# Patient Record
Sex: Female | Born: 1940 | Race: Black or African American | Hispanic: No | Marital: Married | Smoking: Never smoker
Health system: Southern US, Community
[De-identification: ages and names within clinical notes are randomized; demographics above are authoritative.]

## PROBLEM LIST (undated history)

## (undated) DIAGNOSIS — E785 Hyperlipidemia, unspecified: Secondary | ICD-10-CM

## (undated) DIAGNOSIS — R001 Bradycardia, unspecified: Secondary | ICD-10-CM

## (undated) DIAGNOSIS — I10 Essential (primary) hypertension: Secondary | ICD-10-CM

## (undated) DIAGNOSIS — K55049 Acute infarction of large intestine, extent unspecified: Secondary | ICD-10-CM

## (undated) DIAGNOSIS — I4891 Unspecified atrial fibrillation: Secondary | ICD-10-CM

## (undated) DIAGNOSIS — Z87440 Personal history of urinary (tract) infections: Secondary | ICD-10-CM

## (undated) DIAGNOSIS — L739 Follicular disorder, unspecified: Secondary | ICD-10-CM

## (undated) DIAGNOSIS — IMO0002 Reserved for concepts with insufficient information to code with codable children: Secondary | ICD-10-CM

## (undated) DIAGNOSIS — R3129 Other microscopic hematuria: Secondary | ICD-10-CM

## (undated) DIAGNOSIS — H35341 Macular cyst, hole, or pseudohole, right eye: Secondary | ICD-10-CM

## (undated) DIAGNOSIS — M778 Other enthesopathies, not elsewhere classified: Secondary | ICD-10-CM

## (undated) HISTORY — DX: Bradycardia, unspecified: R00.1

## (undated) HISTORY — DX: Reserved for concepts with insufficient information to code with codable children: IMO0002

## (undated) HISTORY — DX: Follicular disorder, unspecified: L73.9

## (undated) HISTORY — DX: Hyperlipidemia, unspecified: E78.5

## (undated) HISTORY — DX: Acute infarction of large intestine, extent unspecified: K55.049

## (undated) HISTORY — DX: Personal history of urinary (tract) infections: Z87.440

## (undated) HISTORY — DX: Unspecified atrial fibrillation: I48.91

## (undated) HISTORY — DX: Essential (primary) hypertension: I10

## (undated) HISTORY — DX: Other microscopic hematuria: R31.29

## (undated) HISTORY — DX: Other enthesopathies, not elsewhere classified: M77.8

## (undated) HISTORY — DX: Macular cyst, hole, or pseudohole, right eye: H35.341

---

## 1984-09-11 HISTORY — PX: ABDOMINAL HYSTERECTOMY: SHX81

## 2004-06-15 ENCOUNTER — Ambulatory Visit: Payer: Self-pay | Admitting: Internal Medicine

## 2004-06-30 ENCOUNTER — Ambulatory Visit: Payer: Self-pay | Admitting: Family Medicine

## 2005-07-03 ENCOUNTER — Ambulatory Visit: Payer: Self-pay | Admitting: Family Medicine

## 2006-08-23 ENCOUNTER — Ambulatory Visit: Payer: Self-pay | Admitting: Family Medicine

## 2008-03-06 ENCOUNTER — Ambulatory Visit: Payer: Self-pay | Admitting: Family Medicine

## 2008-03-09 ENCOUNTER — Ambulatory Visit: Payer: Self-pay | Admitting: Family Medicine

## 2008-03-11 HISTORY — PX: BREAST BIOPSY: SHX20

## 2008-09-14 ENCOUNTER — Ambulatory Visit: Payer: Self-pay | Admitting: General Surgery

## 2009-01-06 ENCOUNTER — Ambulatory Visit: Payer: Self-pay | Admitting: Ophthalmology

## 2009-01-20 ENCOUNTER — Ambulatory Visit: Payer: Self-pay | Admitting: Ophthalmology

## 2009-04-06 ENCOUNTER — Ambulatory Visit: Payer: Self-pay | Admitting: Family Medicine

## 2009-09-11 HISTORY — PX: EYE SURGERY: SHX253

## 2010-03-08 ENCOUNTER — Emergency Department: Payer: Self-pay | Admitting: Emergency Medicine

## 2010-08-11 ENCOUNTER — Ambulatory Visit: Payer: Self-pay | Admitting: Family Medicine

## 2011-04-18 ENCOUNTER — Ambulatory Visit: Payer: Self-pay | Admitting: Family Medicine

## 2011-09-08 ENCOUNTER — Ambulatory Visit: Payer: Self-pay | Admitting: Family Medicine

## 2011-11-29 ENCOUNTER — Ambulatory Visit: Payer: Self-pay | Admitting: Gastroenterology

## 2012-10-22 ENCOUNTER — Ambulatory Visit: Payer: Self-pay | Admitting: Family Medicine

## 2013-04-29 ENCOUNTER — Ambulatory Visit: Payer: Self-pay

## 2013-11-05 ENCOUNTER — Ambulatory Visit: Payer: Self-pay | Admitting: Family Medicine

## 2013-11-12 ENCOUNTER — Ambulatory Visit: Payer: Self-pay | Admitting: Family Medicine

## 2014-12-30 ENCOUNTER — Ambulatory Visit
Admit: 2014-12-30 | Disposition: A | Discharge status: Home / Self Care | Payer: Self-pay | Attending: Family Medicine | Admitting: Family Medicine

## 2015-07-13 ENCOUNTER — Other Ambulatory Visit: Payer: Self-pay | Admitting: Family Medicine

## 2015-07-13 DIAGNOSIS — Z780 Asymptomatic menopausal state: Secondary | ICD-10-CM

## 2015-07-19 ENCOUNTER — Encounter: Payer: Self-pay | Admitting: *Deleted

## 2015-07-20 ENCOUNTER — Encounter: Payer: Self-pay | Admitting: *Deleted

## 2015-07-21 ENCOUNTER — Encounter: Payer: Self-pay | Admitting: Obstetrics and Gynecology

## 2015-07-21 ENCOUNTER — Ambulatory Visit (INDEPENDENT_AMBULATORY_CARE_PROVIDER_SITE_OTHER): Payer: Medicare Other | Admitting: Obstetrics and Gynecology

## 2015-07-21 VITALS — BP 162/92 | HR 86 | Resp 16 | Ht 63.0 in | Wt 158.7 lb

## 2015-07-21 DIAGNOSIS — R3129 Other microscopic hematuria: Secondary | ICD-10-CM | POA: Diagnosis not present

## 2015-07-21 LAB — URINALYSIS, COMPLETE
Bilirubin, UA: NEGATIVE
Glucose, UA: NEGATIVE
Ketones, UA: NEGATIVE
Nitrite, UA: NEGATIVE
PH UA: 5 (ref 5.0–7.5)
PROTEIN UA: NEGATIVE
Specific Gravity, UA: 1.015 (ref 1.005–1.030)
Urobilinogen, Ur: 0.2 mg/dL (ref 0.2–1.0)

## 2015-07-21 LAB — MICROSCOPIC EXAMINATION

## 2015-07-21 NOTE — Patient Instructions (Signed)

## 2015-07-21 NOTE — Progress Notes (Signed)
07/21/2015 12:45 PM   Amy Rowland 08/02/1941 [id number]  Referring provider: No referring provider defined for this encounter.  Chief Complaint  Patient presents with  . Hematuria  . Establish Care    HPI: Patient is a 74yo female presenting today as a referral from her PCP for hematuria (microscopic and gross) and possible recurrent urinary tract infections.  Patient reports an episode of gross hematuria in June.  She states she was diagnosed with a UTI and treated with Bactrim.  She denies nausea, vomiting, flank pain or unexplained weight loss.  Per referral notes patient's urine cultures were negative.  She does take Pradaxa for a-fib.  Hematuria work up including CT Urogram and cystoscopy negative in 06/2013.  Patient reports symptoms of frequency and dysuria when she does have an "infection".  She denies any urinary symptoms today.  PMH: Past Medical History  Diagnosis Date  . Tendonitis of elbow or forearm   . Folliculitis   . HLD (hyperlipidemia)   . Macular hole of right eye   . Atrial fibrillation (HCC)   . Infarction of colon (HCC)   . History of recurrent UTIs   . HTN (hypertension)   . Bradycardia   . Hematuria, microscopic     Surgical History: Past Surgical History  Procedure Laterality Date  . Breast biopsy  2011  . Eye surgery Right 2011  . Abdominal hysterectomy  1986    Home Medications:    Medication List       This list is accurate as of: 07/21/15 12:45 PM.  Always use your most recent med list.               clotrimazole 1 % cream  Commonly known as:  LOTRIMIN  Apply 1 application topically 2 (two) times daily.     CRANBERRY EXTRACT PO  Take by mouth.     [employer] ATHLETES FOOT 1 % cream  Generic drug:  terbinafine  APPLY TOPICALLY ONCE DAILY.     dabigatran 150 MG Caps capsule  Commonly known as:  PRADAXA  Take 150 mg by mouth 2 (two) times daily.     diclofenac sodium 1 % Gel  Commonly known as:  VOLTAREN  Apply  topically 4 (four) times daily.     GARLIC PO  Take 1 mg by mouth.     lisinopril-hydrochlorothiazide 10-12.5 MG tablet  Commonly known as:  PRINZIDE,ZESTORETIC  Take 1 tablet by mouth daily.     metoprolol tartrate 25 MG tablet  Commonly known as:  LOPRESSOR  Take 25 mg by mouth 2 (two) times daily.     OMEGA 3 PO  Take by mouth.     pravastatin 20 MG tablet  Commonly known as:  PRAVACHOL  Take 20 mg by mouth daily.     Vitamin D3 2000 UNITS Tabs  Take 1 tablet by mouth.        Allergies:  Allergies  Allergen Reactions  . Penicillins     Family History: Family History  Problem Relation Age of Onset  . Prostate cancer Brother   . Hematuria Mother     Social History:  reports that she has never smoked. She does not have any smokeless tobacco history on file. She reports that she does not drink alcohol. Her drug history is not on file.  ROS: UROLOGY Frequent Urination?: No Hard to postpone urination?: No Burning/pain with urination?: No Get up at night to urinate?: No Leakage of urine?: No Urine stream starts and  stops?: No Trouble starting stream?: No Do you have to strain to urinate?: No Blood in urine?: No Urinary tract infection?: No Sexually transmitted disease?: No Injury to kidneys or bladder?: No Painful intercourse?: No Weak stream?: No Currently pregnant?: No Vaginal bleeding?: No Last menstrual period?: n  Gastrointestinal Nausea?: No Vomiting?: No Indigestion/heartburn?: No Diarrhea?: No Constipation?: No  Constitutional Fever: No Night sweats?: No Weight loss?: No Fatigue?: No  Skin Skin rash/lesions?: Yes Itching?: No  Eyes Blurred vision?: Yes Double vision?: No  Ears/Nose/Throat Sore throat?: No Sinus problems?: Yes  Hematologic/Lymphatic Swollen glands?: No Easy bruising?: Yes  Cardiovascular Leg swelling?: No Chest pain?: No  Respiratory Cough?: No Shortness of breath?: No  Endocrine Excessive thirst?:  No  Musculoskeletal Back pain?: Yes Joint pain?: Yes  Neurological Headaches?: No Dizziness?: Yes  Psychologic Depression?: No Anxiety?: No  Physical Exam: BP 162/92 mmHg  Pulse 86  Resp 16  Ht 5\' 3"  (1.6 m)  Wt 158 lb 11.2 oz (71.986 kg)  BMI 28.12 kg/m2  Constitutional:  Alert and oriented, No acute distress. HEENT: [place] AT, moist mucus membranes.  Trachea midline, no masses. Cardiovascular: No clubbing, cyanosis, or edema. Respiratory: Normal respiratory effort, no increased work of breathing. GI: Abdomen is soft, nontender, nondistended, no abdominal masses GU: No CVA tenderness.  Skin: No rashes, bruises or suspicious lesions. Lymph: No cervical or inguinal adenopathy. Neurologic: Grossly intact, no focal deficits, moving all 4 extremities. Psychiatric: Normal mood and affect.  Laboratory Data:   Urinalysis Results for orders placed or performed in visit on 07/21/15  Microscopic Examination  Result Value Ref Range   WBC, UA 0-5 0 -  5 /hpf   RBC, UA 3-10 (A) 0 -  2 /hpf   Epithelial Cells (non renal) 0-10 0 - 10 /hpf   Renal Epithel, UA 0-10 (A) None seen /hpf   Mucus, UA Present (A) Not Estab.   Bacteria, UA Few (A) None seen/Few  Urinalysis, Complete  Result Value Ref Range   Specific Gravity, UA 1.015 1.005 - 1.030   pH, UA 5.0 5.0 - 7.5   Color, UA Yellow Yellow   Appearance Ur Clear Clear   Leukocytes, UA 1+ (A) Negative   Protein, UA Negative Negative/Trace   Glucose, UA Negative Negative   Ketones, UA Negative Negative   RBC, UA 3+ (A) Negative   Bilirubin, UA Negative Negative   Urobilinogen, Ur 0.2 0.2 - 1.0 mg/dL   Nitrite, UA Negative Negative   Microscopic Examination See below:      Pertinent Imaging:    Assessment & Plan:    1. Microscopic hematuria- Negative workup in 2014. UA demonstrating 3-10 RBCs today. Patient experiencing recurrent episodes of gross hematuria since, possibly related to UTIs. She is taking Pradaxa.  Will  obtain a RUS and schedule cystoscopy. We can also obtain a urine cytology at cysto appt. - Urinalysis, Complete  2. Recurrent UTI- UTI prevention strategies discussed.  Good perineal hygiene reviewed. Patient is encouraged to increase daily water intake, start cranberry supplements to prevent invasive colonization along the urinary tract and probiotics, especially lactobacillus to restore normal vaginal flora.  Return in about 4 weeks (around 08/18/2015) for Imaging results and possible cystoscopy.  These notes generated with voice recognition software. I apologize for typographical errors.  Earlie LouLindsay Mihaela Fajardo, FNP  [facility] [address], [address] [place], [place] [address] [phone]

## 2015-08-20 ENCOUNTER — Ambulatory Visit (INDEPENDENT_AMBULATORY_CARE_PROVIDER_SITE_OTHER): Payer: Medicare Other | Admitting: Urology

## 2015-08-20 VITALS — BP 155/83 | HR 54 | Ht 63.5 in | Wt 159.6 lb

## 2015-08-20 DIAGNOSIS — R310 Gross hematuria: Secondary | ICD-10-CM

## 2015-08-20 LAB — URINALYSIS, COMPLETE
Bilirubin, UA: NEGATIVE
GLUCOSE, UA: NEGATIVE
KETONES UA: NEGATIVE
NITRITE UA: NEGATIVE
PROTEIN UA: NEGATIVE
SPEC GRAV UA: 1.02 (ref 1.005–1.030)
UUROB: 0.2 mg/dL (ref 0.2–1.0)
pH, UA: 5 (ref 5.0–7.5)

## 2015-08-20 LAB — MICROSCOPIC EXAMINATION

## 2015-08-20 MED ORDER — LIDOCAINE HCL 2 % EX GEL: 1.0000 "application " | Freq: Once | CUTANEOUS | Status: DC

## 2015-08-20 MED ORDER — CIPROFLOXACIN HCL 500 MG PO TABS: 500.0000 mg | ORAL_TABLET | Freq: Once | ORAL | Status: DC

## 2015-08-22 LAB — CULTURE, URINE COMPREHENSIVE

## 2015-08-24 ENCOUNTER — Ambulatory Visit: Payer: Medicare Other | Admitting: Obstetrics and Gynecology

## 2015-09-03 ENCOUNTER — Ambulatory Visit
Admission: RE | Admit: 2015-09-03 | Discharge: 2015-09-03 | Disposition: A | Discharge status: Home / Self Care | Payer: Medicare Other | Source: Ambulatory Visit | Attending: Urology | Admitting: Urology

## 2015-09-03 ENCOUNTER — Other Ambulatory Visit: Payer: Medicare Other

## 2015-09-03 DIAGNOSIS — R310 Gross hematuria: Secondary | ICD-10-CM | POA: Diagnosis present

## 2015-09-03 DIAGNOSIS — R3129 Other microscopic hematuria: Secondary | ICD-10-CM | POA: Diagnosis not present

## 2015-09-03 DIAGNOSIS — N281 Cyst of kidney, acquired: Secondary | ICD-10-CM | POA: Insufficient documentation

## 2015-09-08 ENCOUNTER — Encounter: Payer: Self-pay | Admitting: Urology

## 2015-09-08 ENCOUNTER — Ambulatory Visit (INDEPENDENT_AMBULATORY_CARE_PROVIDER_SITE_OTHER): Payer: Medicare Other | Admitting: Urology

## 2015-09-08 VITALS — BP 172/82 | HR 87 | Ht 63.0 in | Wt 157.8 lb

## 2015-09-08 DIAGNOSIS — R310 Gross hematuria: Secondary | ICD-10-CM

## 2015-09-08 DIAGNOSIS — N390 Urinary tract infection, site not specified: Secondary | ICD-10-CM

## 2015-09-08 DIAGNOSIS — R3129 Other microscopic hematuria: Secondary | ICD-10-CM

## 2015-09-08 LAB — URINALYSIS, COMPLETE
Bilirubin, UA: NEGATIVE
GLUCOSE, UA: NEGATIVE
KETONES UA: NEGATIVE
Leukocytes, UA: NEGATIVE
Nitrite, UA: NEGATIVE
Protein, UA: NEGATIVE
Urobilinogen, Ur: 0.2 mg/dL (ref 0.2–1.0)
pH, UA: 5 (ref 5.0–7.5)

## 2015-09-08 LAB — MICROSCOPIC EXAMINATION
BACTERIA UA: NONE SEEN
Renal Epithel, UA: NONE SEEN /hpf
WBC, UA: NONE SEEN /hpf (ref 0–?)

## 2015-09-08 MED ORDER — CIPROFLOXACIN HCL 500 MG PO TABS
500.0000 mg | ORAL_TABLET | Freq: Once | ORAL | Status: AC
  Administered 2015-09-08: 500 mg via ORAL

## 2015-09-08 MED ORDER — LIDOCAINE HCL 2 % EX GEL
1.0000 "application " | Freq: Once | CUTANEOUS | Status: AC
  Administered 2015-09-08: 1 via URETHRAL

## 2015-09-08 NOTE — Progress Notes (Signed)
3:17 PM  09/08/2015   Amy Rowland 03/03/1941 [id number]  Referring provider: Berneice GandyVicki S Fowler, MD [address] [place] [address] [place], [place] [address]  Chief Complaint  Patient presents with  . Cysto    HPI: 74yo female with hematuria (microscopic and gross) and possible recurrent urinary tract infections.  Patient reports an episode of gross hematuria in June.  She states she was diagnosed with a UTI and treated with Bactrim.  Per referral notes patient's urine cultures were negative.  She does take Pradaxa for a-fib.  Hematuria work up including CT Urogram and cystoscopy negative in 06/2013.  More recently, she underwent renal ultrasound which showed renal cysts on the left side but no other GU pathology identified. She returns to the office today for cystoscopy to complete her repeat hematuria workup.  Today, she denies any urinary symptoms.     PMH: Past Medical History  Diagnosis Date  . Tendonitis of elbow or forearm   . Folliculitis   . HLD (hyperlipidemia)   . Macular hole of right eye   . Atrial fibrillation (HCC)   . Infarction of colon (HCC)   . History of recurrent UTIs   . HTN (hypertension)   . Bradycardia   . Hematuria, microscopic     Surgical History: Past Surgical History  Procedure Laterality Date  . Breast biopsy  2011  . Eye surgery Right 2011  . Abdominal hysterectomy  1986    Home Medications:    Medication List       This list is accurate as of: 09/08/15  3:17 PM.  Always use your most recent med list.               CRANBERRY EXTRACT PO  Take by mouth.     [employer] ATHLETES FOOT 1 % cream  Generic drug:  terbinafine  APPLY TOPICALLY ONCE DAILY.     dabigatran 150 MG Caps capsule  Commonly known as:  PRADAXA  Take 150 mg by mouth 2 (two) times daily.     diclofenac sodium 1 % Gel  Commonly known as:  VOLTAREN  Apply topically 4 (four) times daily.     GARLIC PO  Take 1 mg by mouth.     lisinopril-hydrochlorothiazide  10-12.5 MG tablet  Commonly known as:  PRINZIDE,ZESTORETIC  Take 1 tablet by mouth daily.     metoprolol tartrate 25 MG tablet  Commonly known as:  LOPRESSOR  Take 25 mg by mouth 2 (two) times daily.     OMEGA 3 PO  Take by mouth.     pravastatin 20 MG tablet  Commonly known as:  PRAVACHOL  Take 20 mg by mouth daily.     Probiotic 250 MG Caps  Take 1 capsule by mouth.     vitamin C 1000 MG tablet  Take 1,000 mg by mouth daily.     Vitamin D3 2000 units Tabs  Take 1 tablet by mouth.        Allergies:  Allergies  Allergen Reactions  . Penicillins     Family History: Family History  Problem Relation Age of Onset  . Prostate cancer Brother   . Hematuria Mother     Social History:  reports that she has never smoked. She does not have any smokeless tobacco history on file. She reports that she does not drink alcohol. Her drug history is not on file.   Physical Exam: BP 172/82 mmHg  Pulse 87  Ht 5\' 3"  (1.6 m)  Wt 157 lb 12.8 oz (71.578 kg)  BMI 27.96 kg/m2  LMP  (Approximate)  Constitutional:  Alert and oriented, No acute distress. HEENT: [place] AT, moist mucus membranes.  Trachea midline, no masses. Cardiovascular: No clubbing, cyanosis, or edema. Respiratory: Normal respiratory effort, no increased work of breathing. GI: Abdomen is soft, nontender, nondistended, no abdominal masses GU: No CVA tenderness. Normal external genitalia, normal urethral meatus. Skin: No rashes, bruises or suspicious lesions. Lymph: No cervical or inguinal adenopathy. Neurologic: Grossly intact, no focal deficits, moving all 4 extremities. Psychiatric: Normal mood and affect.  Urinalysis  UA 2+, otherwise negative dip.  3-10 RBC per HPF, otherwise negative.  Pertinent Imaging: CLINICAL DATA: Microscopic hematuria for 2 months  EXAM: RENAL / URINARY TRACT ULTRASOUND COMPLETE  COMPARISON: CT abdomen and pelvis April 29, 2013  FINDINGS: Right Kidney:  Length: 11.2 cm.  Echogenicity and renal cortical thickness are within normal limits. No mass, perinephric fluid, or hydronephrosis visualized. No sonographically demonstrable calculus or ureterectasis.  Left Kidney:  Length: 10.5 cm. Echogenicity and renal cortical thickness are within normal limits. No perinephric fluid or hydronephrosis visualized. There is a cyst arising from the upper pole of the left kidney measuring 3.5 x 3.5 x 3.9 cm. There is a cyst in the mid left kidney measuring 1.7 x 1.4 x 1.6 cm. No sonographically demonstrable calculus or ureterectasis.  Bladder:  Appears normal for degree of bladder distention.  IMPRESSION: There are renal cysts on the left. Study otherwise unremarkable. A cause for microhematuria has not been established with this study.   Electronically Signed  By: Bretta Bang III M.D.  On: 09/03/2015 11:41    Cystoscopy Procedure Note  Patient identification was confirmed, informed consent was obtained, and patient was prepped using Betadine solution.  Lidocaine jelly was administered per urethral meatus.    Preoperative abx where received prior to procedure.    Procedure: - Flexible cystoscope introduced, without any difficulty.   - Thorough search of the bladder revealed:    normal urethral meatus    normal urothelium    no stones    no ulcers     no tumors    no urethral polyps    no trabeculation  - Ureteral orifices were normal in position and appearance.  Post-Procedure: - Patient tolerated the procedure well   Assessment & Plan:    1. Microscopic hematuria- Negative workup in 2014. Repeat RUS and cysto today negative.  Recommend f/u in 2-3 years if microscopic hematuria persists. - Urinalysis, Complete  2. Recurrent UTI- continue UTI prevention strategies discussed.    Follow up as needed  Vanna Scotland, MD  [facility] [address] [place], [place] [address] [phone]

## 2016-01-03 ENCOUNTER — Ambulatory Visit
Admission: RE | Admit: 2016-01-03 | Discharge: 2016-01-03 | Disposition: A | Discharge status: Home / Self Care | Payer: Medicare Other | Source: Ambulatory Visit | Attending: Family Medicine | Admitting: Family Medicine

## 2016-01-03 DIAGNOSIS — Z780 Asymptomatic menopausal state: Secondary | ICD-10-CM

## 2016-01-03 DIAGNOSIS — Z1231 Encounter for screening mammogram for malignant neoplasm of breast: Secondary | ICD-10-CM | POA: Diagnosis present

## 2016-04-24 ENCOUNTER — Other Ambulatory Visit: Payer: Self-pay | Admitting: Pediatrics

## 2016-04-24 DIAGNOSIS — M8580 Other specified disorders of bone density and structure, unspecified site: Secondary | ICD-10-CM

## 2017-01-03 ENCOUNTER — Other Ambulatory Visit: Payer: Self-pay | Admitting: Pediatrics

## 2017-01-03 DIAGNOSIS — M8580 Other specified disorders of bone density and structure, unspecified site: Secondary | ICD-10-CM

## 2017-01-03 DIAGNOSIS — Z1239 Encounter for other screening for malignant neoplasm of breast: Secondary | ICD-10-CM

## 2017-02-27 ENCOUNTER — Other Ambulatory Visit: Payer: Medicare Other

## 2017-02-27 ENCOUNTER — Ambulatory Visit: Payer: Medicare Other

## 2017-03-20 ENCOUNTER — Ambulatory Visit
Admission: RE | Admit: 2017-03-20 | Discharge: 2017-03-20 | Disposition: A | Discharge status: Home / Self Care | Payer: Medicare Other | Source: Ambulatory Visit | Attending: Pediatrics | Admitting: Pediatrics

## 2017-03-20 DIAGNOSIS — Z1239 Encounter for other screening for malignant neoplasm of breast: Secondary | ICD-10-CM

## 2017-03-20 DIAGNOSIS — Z1231 Encounter for screening mammogram for malignant neoplasm of breast: Secondary | ICD-10-CM | POA: Diagnosis not present

## 2017-03-20 DIAGNOSIS — M85852 Other specified disorders of bone density and structure, left thigh: Secondary | ICD-10-CM | POA: Insufficient documentation

## 2017-03-20 DIAGNOSIS — M8580 Other specified disorders of bone density and structure, unspecified site: Secondary | ICD-10-CM

## 2017-03-20 DIAGNOSIS — M8588 Other specified disorders of bone density and structure, other site: Secondary | ICD-10-CM | POA: Diagnosis not present

## 2017-05-27 IMAGING — US US RENAL
1 series · 14 of 25 positions shown · non-contrast
Comparison: CT abdomen and pelvis April 29, 2013

CLINICAL DATA: Microscopic hematuria for 2 months

EXAM:
RENAL / URINARY TRACT ULTRASOUND COMPLETE

[Series 1: us renal · 0.23mm/px · 14 of 65 slices shown]
[im 1/65]
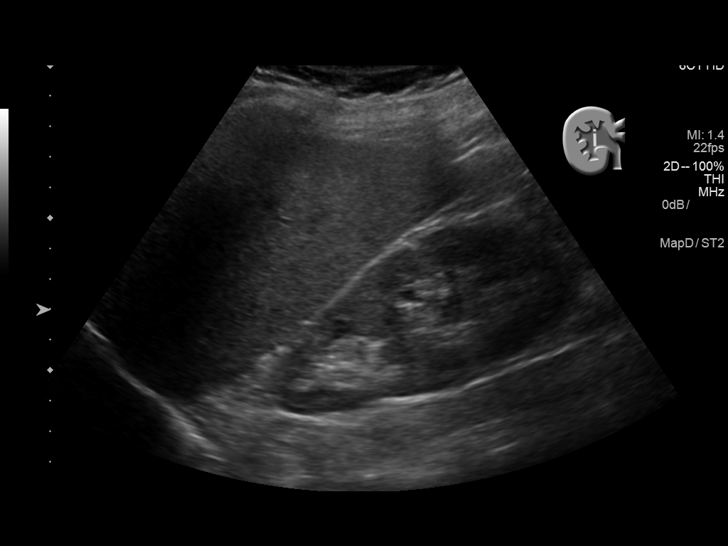
[im 6/65]
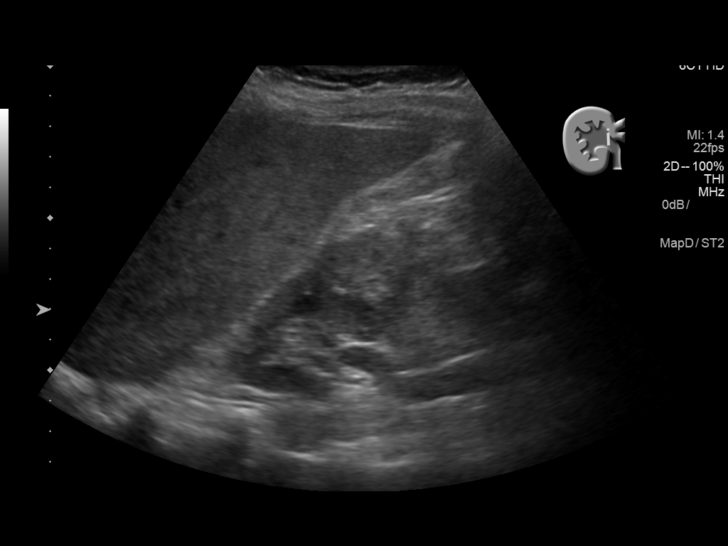
[im 11/65]
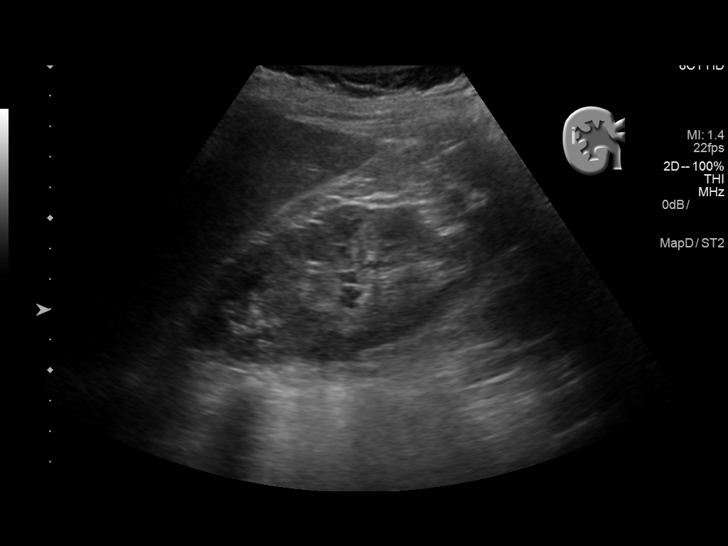
[im 17/65]
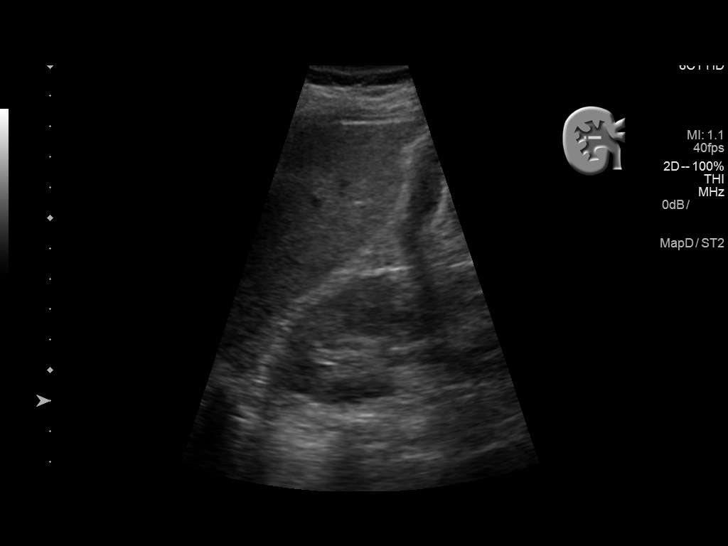
[im 22/65]
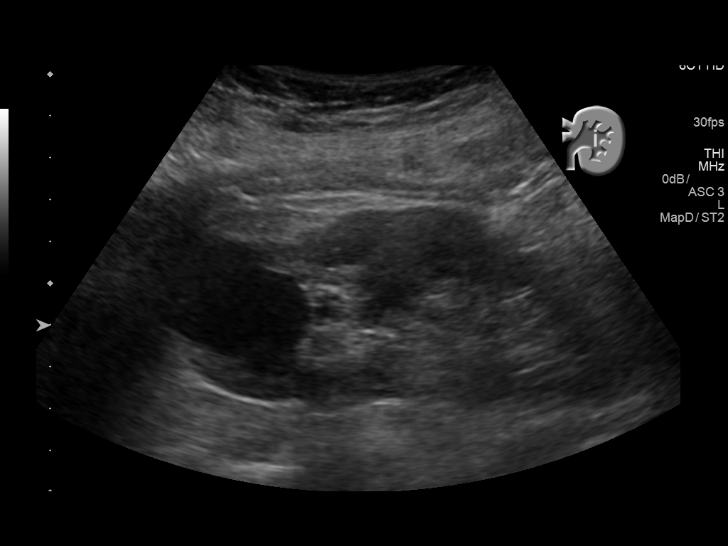
[im 25/65]
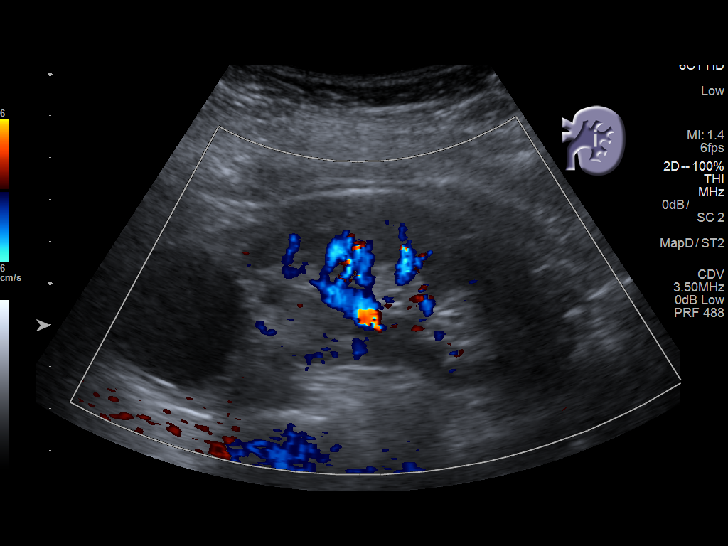
[im 30/65]
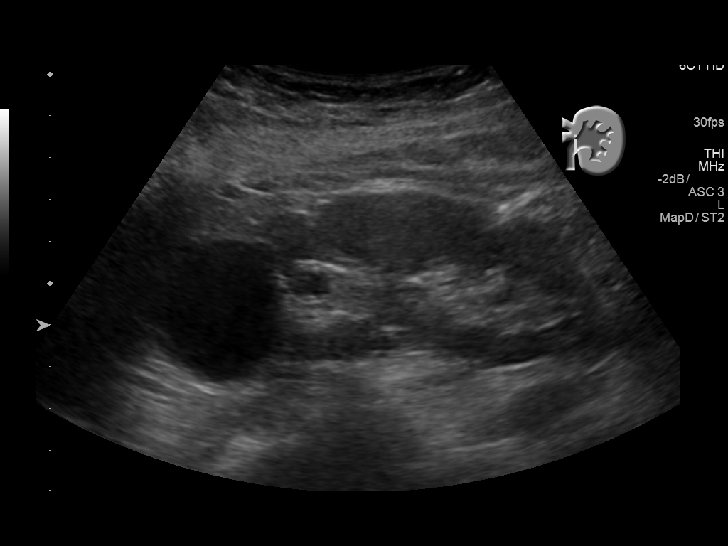
[im 35/65]
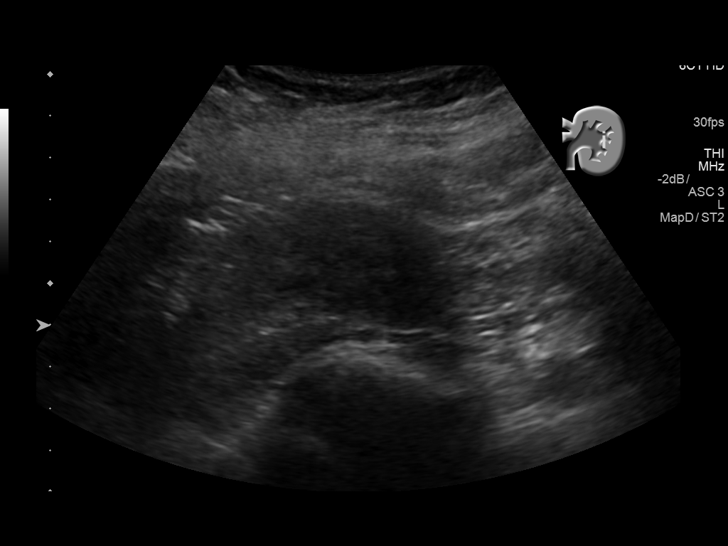
[im 41/65]
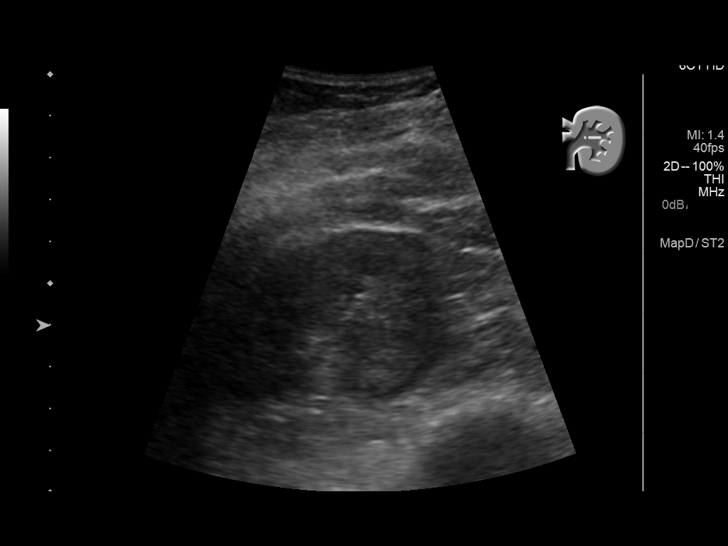
[im 43/65]
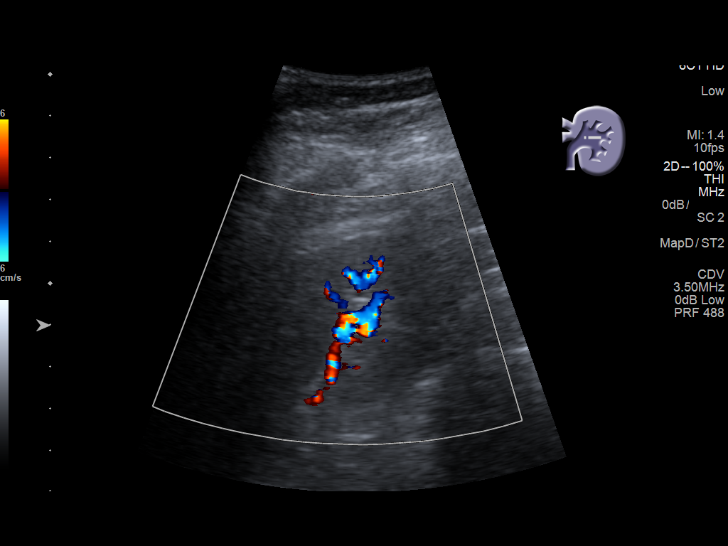
[im 49/65]
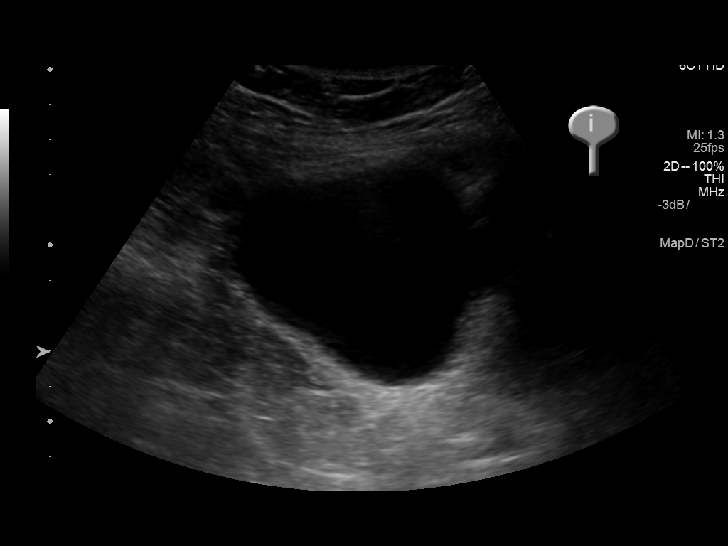
[im 54/65]
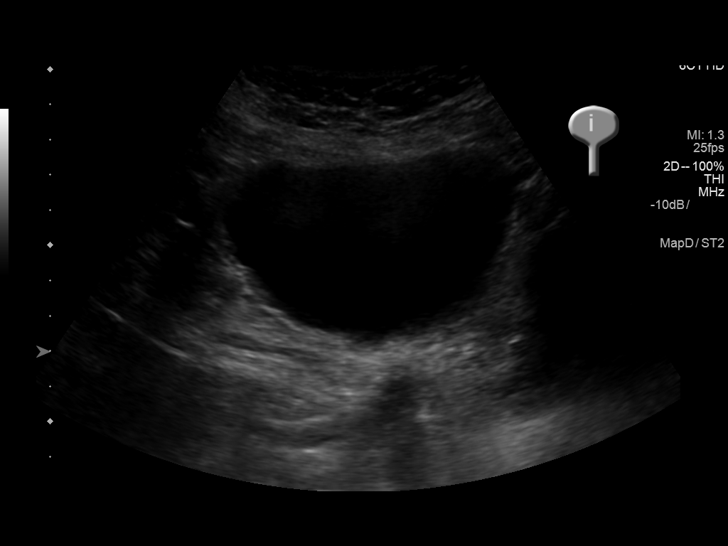
[im 59/65]
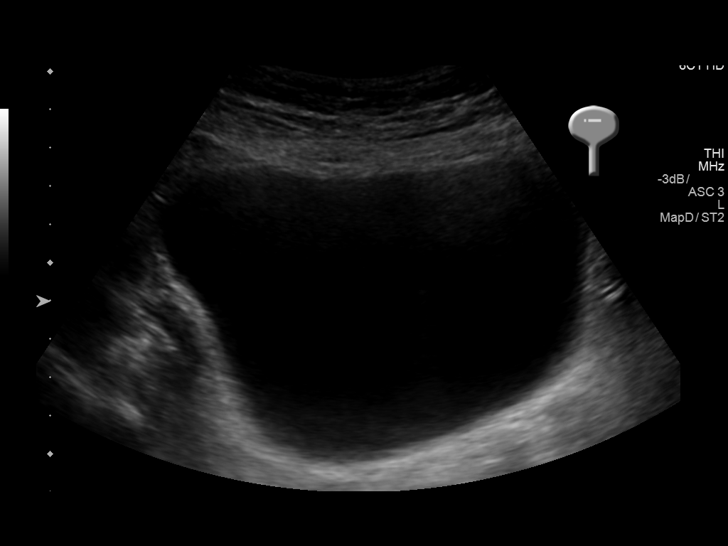
[im 65/65]
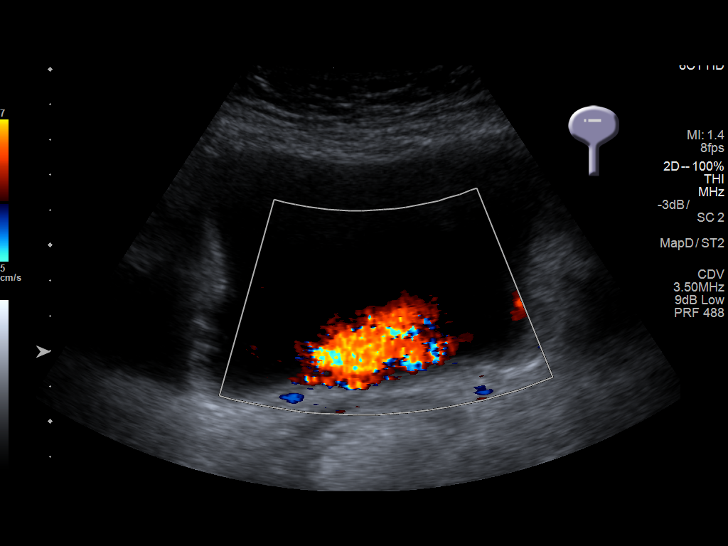

[14 of 25 positions shown; findings below may reference images not displayed]

FINDINGS: Right Kidney:

Length: 11.2 cm. Echogenicity and renal cortical thickness are
within normal limits. No mass, perinephric fluid, or hydronephrosis
visualized. No sonographically demonstrable calculus or
ureterectasis.

Left Kidney:

Length: 10.5 cm. Echogenicity and renal cortical thickness are
within normal limits. No perinephric fluid or hydronephrosis
visualized. There is a cyst arising from the upper pole of the left
kidney measuring 3.5 x 3.5 x 3.9 cm. There is a cyst in the mid left
kidney measuring 1.7 x 1.4 x 1.6 cm. No sonographically demonstrable
calculus or ureterectasis.

Bladder:

Appears normal for degree of bladder distention.
IMPRESSION: There are renal cysts on the left. Study otherwise unremarkable. A
cause for microhematuria has not been established with this study.

## 2017-09-18 ENCOUNTER — Ambulatory Visit: Admit: 2017-09-18 | Payer: Medicare Other | Admitting: Gastroenterology

## 2017-09-18 SURGERY — COLONOSCOPY WITH PROPOFOL
Anesthesia: General

## 2018-02-20 ENCOUNTER — Other Ambulatory Visit: Payer: Self-pay | Admitting: Pediatrics

## 2018-02-20 DIAGNOSIS — Z1231 Encounter for screening mammogram for malignant neoplasm of breast: Secondary | ICD-10-CM

## 2018-03-25 ENCOUNTER — Ambulatory Visit
Admission: RE | Admit: 2018-03-25 | Discharge: 2018-03-25 | Disposition: A | Discharge status: Home / Self Care | Payer: Medicare Other | Source: Ambulatory Visit | Attending: Pediatrics | Admitting: Pediatrics

## 2018-03-25 DIAGNOSIS — Z1231 Encounter for screening mammogram for malignant neoplasm of breast: Secondary | ICD-10-CM | POA: Diagnosis not present

## 2018-09-16 ENCOUNTER — Other Ambulatory Visit
Admission: RE | Admit: 2018-09-16 | Discharge: 2018-09-16 | Disposition: A | Discharge status: Home / Self Care | Payer: Medicare Other | Attending: Dermatology | Admitting: Dermatology

## 2018-09-16 DIAGNOSIS — L439 Lichen planus, unspecified: Secondary | ICD-10-CM | POA: Insufficient documentation

## 2018-09-16 LAB — FERRITIN: Ferritin: 29 ng/mL (ref 11–307)

## 2018-09-16 LAB — CBC WITH DIFFERENTIAL/PLATELET
Abs Immature Granulocytes: 0.01 10*3/uL (ref 0.00–0.07)
Basophils Absolute: 0 10*3/uL (ref 0.0–0.1)
Basophils Relative: 1 %
Eosinophils Absolute: 0.1 10*3/uL (ref 0.0–0.5)
Eosinophils Relative: 3 %
HEMATOCRIT: 36.8 % (ref 36.0–46.0)
Hemoglobin: 12.1 g/dL (ref 12.0–15.0)
Immature Granulocytes: 0 %
LYMPHS ABS: 0.9 10*3/uL (ref 0.7–4.0)
Lymphocytes Relative: 25 %
MCH: 30.4 pg (ref 26.0–34.0)
MCHC: 32.9 g/dL (ref 30.0–36.0)
MCV: 92.5 fL (ref 80.0–100.0)
MONO ABS: 0.5 10*3/uL (ref 0.1–1.0)
MONOS PCT: 14 %
Neutro Abs: 2 10*3/uL (ref 1.7–7.7)
Neutrophils Relative %: 57 %
Platelets: 234 10*3/uL (ref 150–400)
RBC: 3.98 MIL/uL (ref 3.87–5.11)
RDW: 12.9 % (ref 11.5–15.5)
WBC: 3.5 10*3/uL — ABNORMAL LOW (ref 4.0–10.5)
nRBC: 0 % (ref 0.0–0.2)

## 2018-09-16 LAB — TSH: TSH: 0.949 u[IU]/mL (ref 0.350–4.500)

## 2018-09-17 LAB — VITAMIN D 25 HYDROXY (VIT D DEFICIENCY, FRACTURES): Vit D, 25-Hydroxy: 44.8 ng/mL (ref 30.0–100.0)

## 2019-11-11 ENCOUNTER — Other Ambulatory Visit: Payer: Self-pay | Admitting: Pediatrics

## 2019-11-11 DIAGNOSIS — Z1231 Encounter for screening mammogram for malignant neoplasm of breast: Secondary | ICD-10-CM

## 2019-12-09 ENCOUNTER — Encounter (INDEPENDENT_AMBULATORY_CARE_PROVIDER_SITE_OTHER): Payer: Self-pay

## 2019-12-09 ENCOUNTER — Other Ambulatory Visit
Admission: RE | Admit: 2019-12-09 | Discharge: 2019-12-09 | Disposition: A | Discharge status: Home / Self Care | Payer: Medicare Other | Source: Ambulatory Visit | Attending: Dermatology | Admitting: Dermatology

## 2019-12-09 ENCOUNTER — Other Ambulatory Visit: Payer: Self-pay

## 2019-12-09 ENCOUNTER — Ambulatory Visit
Admission: RE | Admit: 2019-12-09 | Discharge: 2019-12-09 | Disposition: A | Discharge status: Home / Self Care | Payer: Medicare Other | Source: Ambulatory Visit | Attending: Pediatrics | Admitting: Pediatrics

## 2019-12-09 DIAGNOSIS — Z1231 Encounter for screening mammogram for malignant neoplasm of breast: Secondary | ICD-10-CM | POA: Diagnosis present

## 2019-12-09 LAB — VITAMIN D 25 HYDROXY (VIT D DEFICIENCY, FRACTURES): Vit D, 25-Hydroxy: 110.98 ng/mL — ABNORMAL HIGH (ref 30–100)

## 2020-03-16 ENCOUNTER — Other Ambulatory Visit: Payer: Self-pay

## 2020-03-16 ENCOUNTER — Other Ambulatory Visit
Admission: RE | Admit: 2020-03-16 | Discharge: 2020-03-16 | Disposition: A | Discharge status: Home / Self Care | Payer: Medicare Other | Attending: Dermatology | Admitting: Dermatology

## 2020-03-16 DIAGNOSIS — E559 Vitamin D deficiency, unspecified: Secondary | ICD-10-CM | POA: Insufficient documentation

## 2020-03-16 LAB — VITAMIN D 25 HYDROXY (VIT D DEFICIENCY, FRACTURES): Vit D, 25-Hydroxy: 53.65 ng/mL (ref 30–100)

## 2020-12-27 ENCOUNTER — Other Ambulatory Visit: Payer: Self-pay | Admitting: Pediatrics

## 2020-12-27 DIAGNOSIS — Z1231 Encounter for screening mammogram for malignant neoplasm of breast: Secondary | ICD-10-CM

## 2021-01-06 ENCOUNTER — Ambulatory Visit
Admission: RE | Admit: 2021-01-06 | Discharge: 2021-01-06 | Disposition: A | Discharge status: Home / Self Care | Payer: Medicare Other | Source: Ambulatory Visit | Attending: Pediatrics | Admitting: Pediatrics

## 2021-01-06 ENCOUNTER — Other Ambulatory Visit: Payer: Self-pay

## 2021-01-06 DIAGNOSIS — Z1231 Encounter for screening mammogram for malignant neoplasm of breast: Secondary | ICD-10-CM | POA: Diagnosis present

## 2021-11-21 ENCOUNTER — Other Ambulatory Visit: Payer: Self-pay | Admitting: Pediatrics

## 2021-11-21 DIAGNOSIS — Z780 Asymptomatic menopausal state: Secondary | ICD-10-CM

## 2021-11-22 ENCOUNTER — Other Ambulatory Visit: Payer: Self-pay | Admitting: Pediatrics

## 2021-11-22 DIAGNOSIS — Z1231 Encounter for screening mammogram for malignant neoplasm of breast: Secondary | ICD-10-CM

## 2022-01-12 ENCOUNTER — Ambulatory Visit
Admission: RE | Admit: 2022-01-12 | Discharge: 2022-01-12 | Disposition: A | Discharge status: Home / Self Care | Payer: Medicare Other | Source: Ambulatory Visit | Attending: Pediatrics | Admitting: Pediatrics

## 2022-01-12 DIAGNOSIS — Z780 Asymptomatic menopausal state: Secondary | ICD-10-CM | POA: Diagnosis present

## 2022-01-12 DIAGNOSIS — Z1231 Encounter for screening mammogram for malignant neoplasm of breast: Secondary | ICD-10-CM | POA: Diagnosis not present

## 2022-05-10 IMAGING — MG MM DIGITAL SCREENING BILAT W/ TOMO AND CAD
8 series · 8 of 24 positions shown · non-contrast
Comparison: Previous exam(s).

CLINICAL DATA: Screening.

EXAM:
DIGITAL SCREENING BILATERAL MAMMOGRAM WITH TOMOSYNTHESIS AND CAD
TECHNIQUE: Bilateral screening digital craniocaudal and mediolateral oblique
mammograms were obtained. Bilateral screening digital breast
tomosynthesis was performed. The images were evaluated with
computer-aided detection.

[L MLO synth-2D]
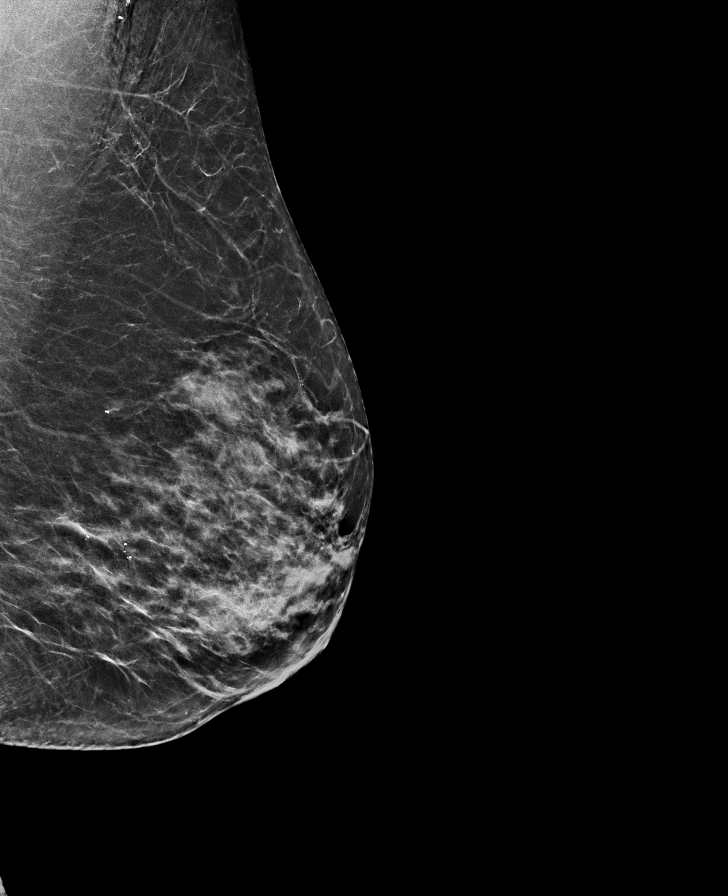

[L CC synth-2D]
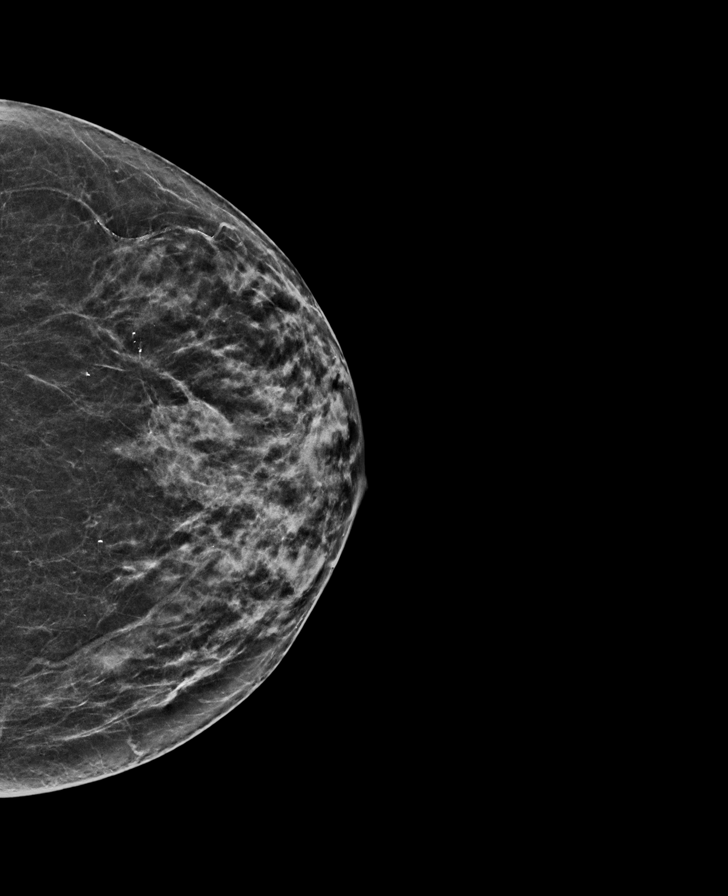

[R MLO synth-2D]
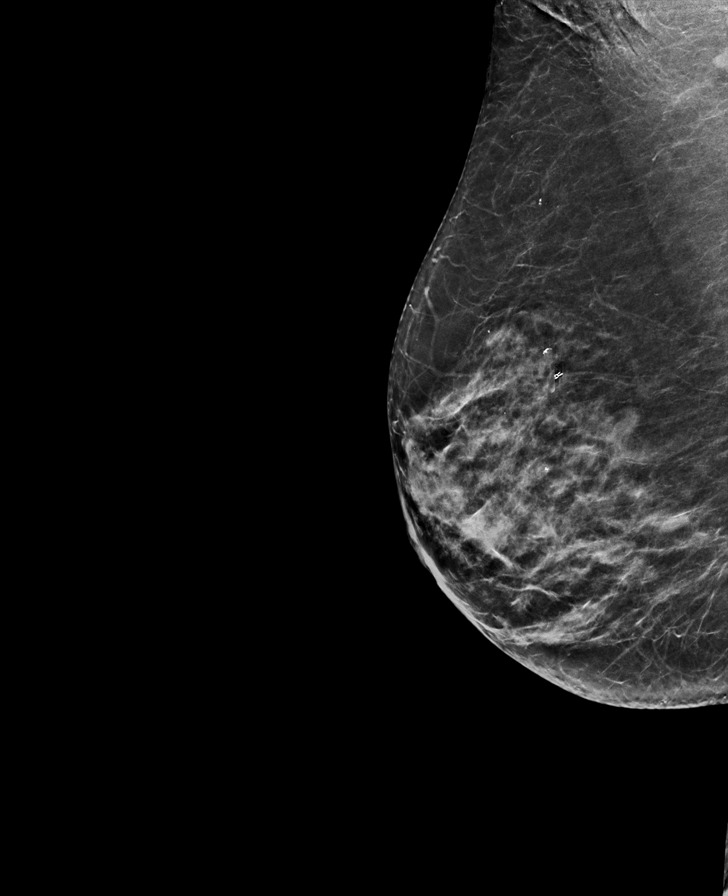

[R CC synth-2D]
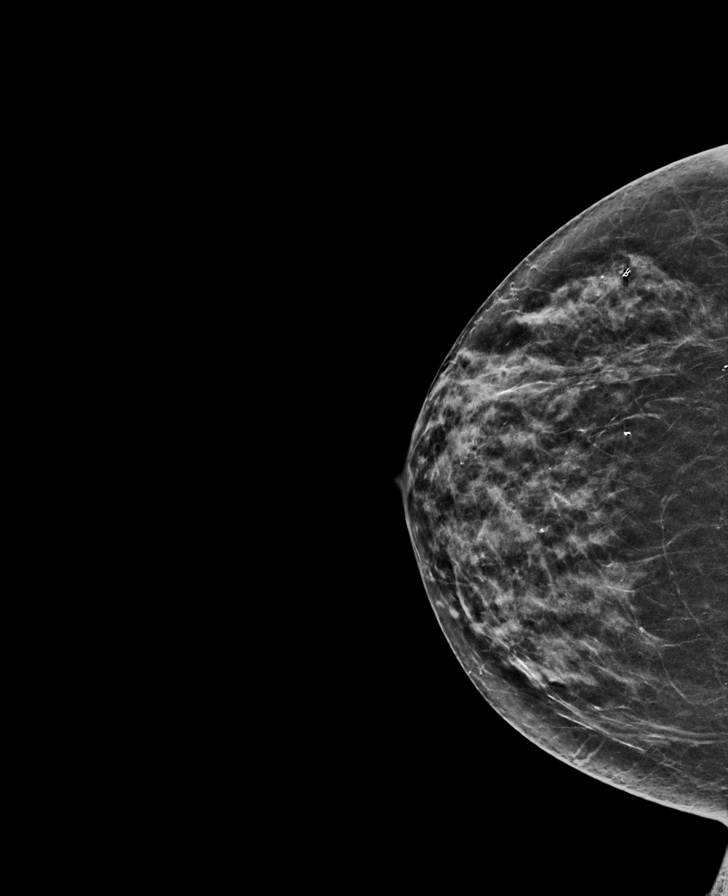

[L MLO tomo · tomo slice 33/65.0]
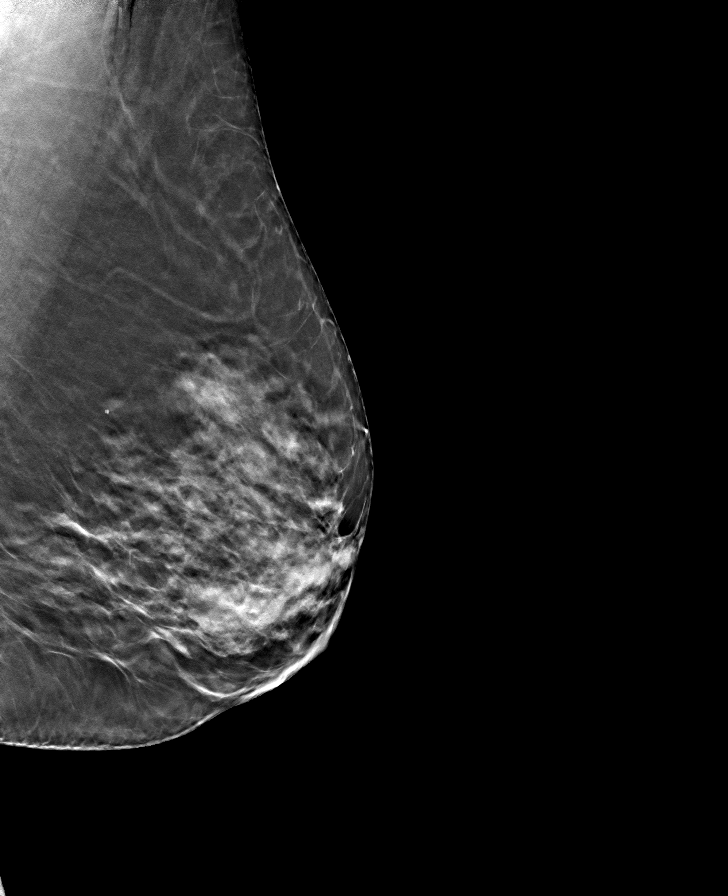

[R MLO tomo · tomo slice 33/66.0]
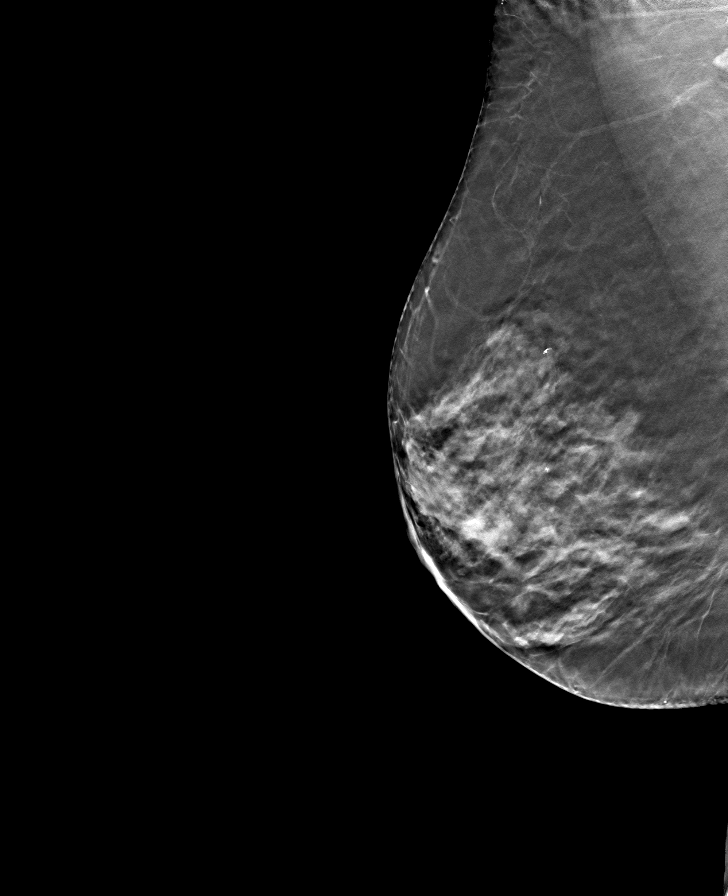

[L CC tomo · tomo slice 29/58.0]
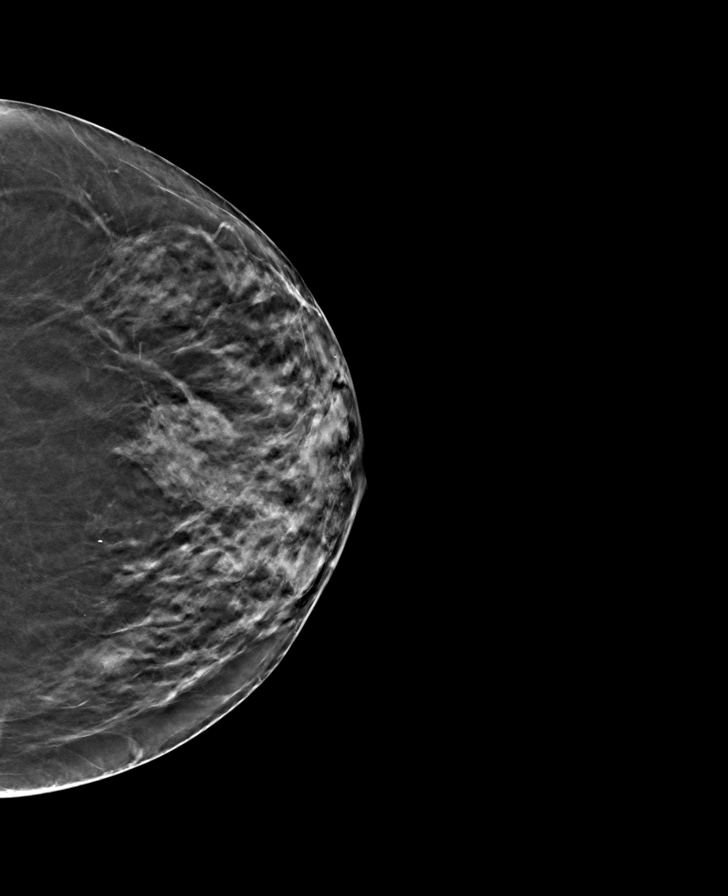

[R CC tomo · tomo slice 31/61.0]
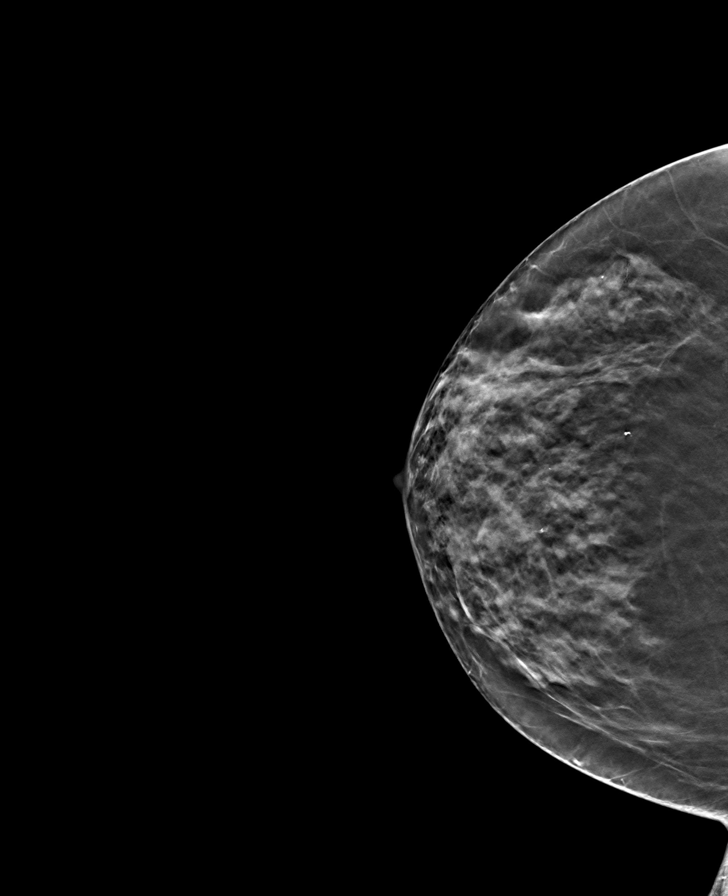

[8 of 24 positions shown; findings below may reference images not displayed]

ACR Breast Density Category c: The breast tissue is heterogeneously
dense, which may obscure small masses.
FINDINGS: There are no findings suspicious for malignancy.
IMPRESSION: No mammographic evidence of malignancy. A result letter of this
screening mammogram will be mailed directly to the patient.

RECOMMENDATION:
Screening mammogram in one year. (Code:Q3-W-BC3)

BI-RADS CATEGORY  1: Negative.

## 2022-12-28 ENCOUNTER — Other Ambulatory Visit: Payer: Self-pay | Admitting: Pediatrics

## 2022-12-28 DIAGNOSIS — Z1231 Encounter for screening mammogram for malignant neoplasm of breast: Secondary | ICD-10-CM

## 2023-01-18 ENCOUNTER — Ambulatory Visit
Admission: RE | Admit: 2023-01-18 | Discharge: 2023-01-18 | Disposition: A | Discharge status: Home / Self Care | Payer: Medicare Other | Source: Ambulatory Visit | Attending: Pediatrics | Admitting: Pediatrics

## 2023-01-18 DIAGNOSIS — Z1231 Encounter for screening mammogram for malignant neoplasm of breast: Secondary | ICD-10-CM | POA: Insufficient documentation

## 2024-01-03 ENCOUNTER — Other Ambulatory Visit: Payer: Self-pay | Admitting: Pediatrics

## 2024-01-03 DIAGNOSIS — Z780 Asymptomatic menopausal state: Secondary | ICD-10-CM

## 2024-01-09 ENCOUNTER — Other Ambulatory Visit: Payer: Self-pay | Admitting: Pediatrics

## 2024-01-09 DIAGNOSIS — Z1231 Encounter for screening mammogram for malignant neoplasm of breast: Secondary | ICD-10-CM

## 2024-01-31 ENCOUNTER — Ambulatory Visit
Admission: EM | Admit: 2024-01-31 | Discharge: 2024-01-31 | Disposition: A | Discharge status: Home / Self Care | Attending: Emergency Medicine | Admitting: Emergency Medicine

## 2024-01-31 ENCOUNTER — Ambulatory Visit (INDEPENDENT_AMBULATORY_CARE_PROVIDER_SITE_OTHER)

## 2024-01-31 DIAGNOSIS — M25571 Pain in right ankle and joints of right foot: Secondary | ICD-10-CM | POA: Diagnosis not present

## 2024-01-31 MED ORDER — METHYLPREDNISOLONE 4 MG PO TBPK: ORAL_TABLET | ORAL | 0 refills | Status: AC

## 2024-01-31 NOTE — ED Provider Notes (Addendum)
 [facility]    CSN: [id number] Arrival date & time: 01/31/24  1205      History   Chief Complaint Chief Complaint  Patient presents with   Foot Pain    HPI Amy Rowland is a 83 y.o. female.   HPI  83 year old female with past medical history significant for tendinitis of elbow, atrial fibrillation, hypertension, hyperlipidemia, infarction of colon, and history of recurrent UTIs presents for evaluation of pain and swelling to her right foot and ankle that started 4 days ago.  She reports that she was doing a lot of work in her garage but she does not remember injuring her foot or ankle or anything falling on her foot or ankle.  She states she is able to bear weight but it does cause pain.  She denies numbness or tingling in her toes.  Past Medical History:  Diagnosis Date   Atrial fibrillation (HCC)    Bradycardia    Folliculitis    Hematuria, microscopic    History of recurrent UTIs    HLD (hyperlipidemia)    HTN (hypertension)    Infarction of colon (HCC)    Macular hole of right eye    Tendonitis of elbow or forearm     There are no active problems to display for this patient.   Past Surgical History:  Procedure Laterality Date   ABDOMINAL HYSTERECTOMY  1986   BREAST BIOPSY Right 03/11/08   bx/ clip-neg   EYE SURGERY Right 2011    OB History   No obstetric history on file.      Home Medications    Prior to Admission medications   Medication Sig Start Date End Date Taking? Authorizing Provider  Ascorbic Acid (VITAMIN C) 1000 MG tablet Take 1,000 mg by mouth daily.   Yes [provider]  Cholecalciferol (VITAMIN D3) 2000 UNITS TABS Take 1 tablet by mouth.   Yes [provider]  CRANBERRY EXTRACT PO Take by mouth.   Yes [provider]  [employer]S ATHLETES FOOT 1 % cream APPLY TOPICALLY ONCE DAILY. 07/12/15  Yes [provider]  dabigatran (PRADAXA) 150 MG CAPS capsule Take 150 mg by mouth 2 (two) times  daily.   Yes [provider]  diclofenac sodium (VOLTAREN) 1 % GEL Apply topically 4 (four) times daily.   Yes [provider]  GARLIC PO Take 1 mg by mouth.   Yes [provider]  lisinopril-hydrochlorothiazide (PRINZIDE,ZESTORETIC) 10-12.5 MG tablet Take 1 tablet by mouth daily.   Yes [provider]  methylPREDNISolone (MEDROL DOSEPAK) 4 MG TBPK tablet Take according to the package insert. 01/31/24  Yes Kent Pear, NP  metoprolol tartrate (LOPRESSOR) 25 MG tablet Take 25 mg by mouth 2 (two) times daily.   Yes [provider]  Omega-3 Fatty Acids (OMEGA 3 PO) Take by mouth.   Yes [provider]  pravastatin (PRAVACHOL) 20 MG tablet Take 20 mg by mouth daily.   Yes [provider]  Saccharomyces boulardii (PROBIOTIC) 250 MG CAPS Take 1 capsule by mouth.   Yes [provider]    Family History Family History  Problem Relation Age of Onset   Prostate cancer Brother    Hematuria Mother    Breast cancer Neg Hx     Social History Social History   Tobacco Use   Smoking status: Never  Vaping Use   Vaping status: Never Used  Substance Use Topics   Alcohol use: No    Alcohol/week: 0.0  standard drinks of alcohol   Drug use: Never     Allergies   Penicillins   Review of Systems Review of Systems  Musculoskeletal:  Positive for arthralgias and joint swelling.  Skin:  Negative for color change.  Neurological:  Negative for weakness and numbness.     Physical Exam Triage Vital Signs ED Triage Vitals  Encounter Vitals Group     BP      Systolic BP Percentile      Diastolic BP Percentile      Pulse      Resp      Temp      Temp src      SpO2      Weight      Height      Head Circumference      Peak Flow      Pain Score      Pain Loc      Pain Education      Exclude from Growth Chart    No data found.  Updated Vital Signs BP (!) 158/72 (BP Location: Left Arm)   Pulse 77   Temp 98.5 F  (36.9 C) (Oral)   Ht 5\' 3"  (1.6 m)   Wt 158 lb (71.7 kg)   SpO2 99%   BMI 27.99 kg/m   Visual Acuity Right Eye Distance:   Left Eye Distance:   Bilateral Distance:    Right Eye Near:   Left Eye Near:    Bilateral Near:     Physical Exam Vitals and nursing note reviewed.  Constitutional:      Appearance: Normal appearance. She is not ill-appearing.  Musculoskeletal:        General: Swelling and tenderness present. No signs of injury.  Skin:    General: Skin is warm and dry.     Capillary Refill: Capillary refill takes less than 2 seconds.     Findings: No bruising or erythema.  Neurological:     General: No focal deficit present.     Mental Status: She is alert and oriented to person, place, and time.      UC Treatments / Results  Labs (all labs ordered are listed, but only abnormal results are displayed) Labs Reviewed - No data to display  EKG   Radiology No results found.  Procedures Procedures (including critical care time)  Medications Ordered in UC Medications - No data to display  Initial Impression / Assessment and Plan / UC Course  I have reviewed the triage vital signs and the nursing notes.  Pertinent labs & imaging results that were available during my care of the patient were reviewed by me and considered in my medical decision making (see chart for details).   Patient is a nontoxic-appearing 84 year old female presenting for evaluation of pain and swelling to her right ankle as outlined in HPI above.  Patient seen as above, the patient's ankle is markedly edematous.  There is also edema extending down onto the dorsum of the foot.  Patient has no tenderness with palpation of her phalanges, metatarsals, or tarsal bones.  Pain is primarily across the anterior aspect of the ankle.  No pain with compression of the medial or lateral malleolus, palpation of the plantar fascia, palpation of the calcaneus, or palpation of the Achilles tendon.  The edema  does extend up the distal third of the right lower leg and is nonpitting in nature.  No calf tenderness and negative Homans' sign on the right.  DP  and PT pulses are 2+.  Differential diagnosis include ankle sprain, stress fracture, tendinopathy, arthritis, and DVT.  Given the lack of calf tenderness and negative Celine Collard' sign DVT is less likely.  I will obtain radiograph of the right ankle to rule out any bony abnormality or arthropathy.  Right ankle radiographs independently reviewed and evaluated by me.  Impression: No evidence of fracture or dislocation.  Soft tissue swelling is present.  Radiology overread is pending. Radiology impression states negative exam.  I will discharge patient home with a diagnosis of occult pain and start her on a low-dose steroid pack starting tomorrow.  I will encourage her to keep her right foot elevated is much as possible.  She may also apply ice to her ankle for 20 minutes at a time, 2-3 times a day, to help with pain and inflammation.  I will also have staff outfit patient with an Aircast for stability and support.   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  This inflammation may be what is causing your pain and swelling.  I have prescribed you an Aircast, which is a stabilization device to help protect your ankle from further injury.  Wear it when you are up and moving.  You may take it off to bathe if and at nighttime when you go to bed.  Keep your right foot and ankle elevated is much as possible to help decrease swelling and aid in pain relief.  You may apply ice to your ankle for 20 minutes at a time, 2-3 times a day, as needed for pain or inflammation.  I have prescribed you a low-dose steroid pack to help with  inflammation.  I will start this tomorrow morning.  Take it according to the package instructions.  If your symptoms do not improve, they worsen, or new symptoms develop I recommend that you either follow-up with your primary care provider or follow-up with orthopedics such as [facility] here in [place] or in [place].   ED Prescriptions     Medication Sig Dispense Auth. Provider   methylPREDNISolone (MEDROL DOSEPAK) 4 MG TBPK tablet Take according to the package insert. 1 each Kent Pear, NP      PDMP not reviewed this encounter.   Kent Pear, NP 01/31/24 1313    Kent Pear, NP 01/31/24 1459

## 2024-01-31 NOTE — ED Triage Notes (Signed)
 Pt c/o right foot pain x4days  Pt does not know what caused the pain and believes she may have twisted her foot  Pt c/o right foot swelling and pain along the top of the foot and ankle.   Pt states that the pain is sharp and shooting and present when she is trying to walk.  Pt denies any falls

## 2024-01-31 NOTE — Discharge Instructions (Addendum)
 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  This inflammation may be what is causing your pain and swelling.  I have prescribed you an Aircast, which is a stabilization device to help protect your ankle from further injury.  Wear it when you are up and moving.  You may take it off to bathe if and at nighttime when you go to bed.  Keep your right foot and ankle elevated is much as possible to help decrease swelling and aid in pain relief.  You may apply ice to your ankle for 20 minutes at a time, 2-3 times a day, as needed for pain or inflammation.  I have prescribed you a low-dose steroid pack to help with inflammation.  I will start this tomorrow morning.  Take it according to the package instructions.  If your symptoms do not improve, they worsen, or new symptoms develop I recommend that you either follow-up with your primary care provider or follow-up with orthopedics such as [facility] here in [place] or in [place].

## 2024-02-05 ENCOUNTER — Ambulatory Visit (HOSPITAL_COMMUNITY): Payer: Self-pay

## 2024-02-06 ENCOUNTER — Ambulatory Visit
Admission: RE | Admit: 2024-02-06 | Discharge: 2024-02-06 | Disposition: A | Discharge status: Home / Self Care | Source: Ambulatory Visit | Attending: Pediatrics | Admitting: Pediatrics

## 2024-02-06 DIAGNOSIS — Z1231 Encounter for screening mammogram for malignant neoplasm of breast: Secondary | ICD-10-CM | POA: Insufficient documentation

## 2024-02-06 DIAGNOSIS — Z780 Asymptomatic menopausal state: Secondary | ICD-10-CM | POA: Diagnosis present
